# Patient Record
Sex: Female | Born: 1995 | Race: Black or African American | Hispanic: No | Marital: Single
Health system: Southern US, Community
[De-identification: ages and names within clinical notes are randomized; demographics above are authoritative.]

---

## 2020-07-03 ENCOUNTER — Emergency Department (HOSPITAL_COMMUNITY): Payer: Self-pay

## 2020-07-03 ENCOUNTER — Other Ambulatory Visit: Payer: Self-pay

## 2020-07-03 ENCOUNTER — Encounter (HOSPITAL_COMMUNITY): Payer: Self-pay

## 2020-07-03 DIAGNOSIS — Z20822 Contact with and (suspected) exposure to covid-19: Secondary | ICD-10-CM | POA: Insufficient documentation

## 2020-07-03 DIAGNOSIS — B349 Viral infection, unspecified: Secondary | ICD-10-CM | POA: Insufficient documentation

## 2020-07-03 DIAGNOSIS — N3000 Acute cystitis without hematuria: Secondary | ICD-10-CM | POA: Insufficient documentation

## 2020-07-03 LAB — COMPREHENSIVE METABOLIC PANEL
ALT: 31 U/L (ref 0–44)
AST: 30 U/L (ref 15–41)
Albumin: 4.2 g/dL (ref 3.5–5.0)
Alkaline Phosphatase: 126 U/L (ref 38–126)
Anion gap: 12 (ref 5–15)
BUN: 8 mg/dL (ref 6–20)
CO2: 19 mmol/L — ABNORMAL LOW (ref 22–32)
Calcium: 10.5 mg/dL — ABNORMAL HIGH (ref 8.9–10.3)
Chloride: 101 mmol/L (ref 98–111)
Creatinine, Ser: 0.84 mg/dL (ref 0.44–1.00)
GFR, Estimated: >60 mL/min (ref >60)
Glucose, Bld: 137 mg/dL — ABNORMAL HIGH (ref 70–99)
Potassium: 3.2 mmol/L — ABNORMAL LOW (ref 3.5–5.1)
Sodium: 132 mmol/L — ABNORMAL LOW (ref 135–145)
Total Bilirubin: 1.9 mg/dL — ABNORMAL HIGH (ref 0.3–1.2)
Total Protein: 8.3 g/dL — ABNORMAL HIGH (ref 6.5–8.1)

## 2020-07-03 LAB — CBC
HCT: 42.2 % (ref 36.0–46.0)
Hemoglobin: 14.5 g/dL (ref 12.0–15.0)
MCH: 30.9 pg (ref 26.0–34.0)
MCHC: 34.4 g/dL (ref 30.0–36.0)
MCV: 90 fL (ref 80.0–100.0)
Platelets: 215 10*3/uL (ref 150–400)
RBC: 4.69 MIL/uL (ref 3.87–5.11)
RDW: 13 % (ref 11.5–15.5)
WBC: 26.5 10*3/uL — ABNORMAL HIGH (ref 4.0–10.5)
nRBC: 0 % (ref 0.0–0.2)

## 2020-07-03 LAB — I-STAT BETA HCG BLOOD, ED (NOT ORDERABLE): I-stat hCG, quantitative: <5 m[IU]/mL (ref <5)

## 2020-07-03 LAB — LIPASE, BLOOD: Lipase: 21 U/L (ref 11–51)

## 2020-07-03 MED ORDER — ONDANSETRON 4 MG PO TBDP
4.0000 mg | ORAL_TABLET | Freq: Once | ORAL | Status: AC | PRN
  Administered 2020-07-03: 4 mg via ORAL
  Filled 2020-07-03: qty 1

## 2020-07-03 NOTE — ED Notes (Signed)
Patient refusing covid-19 swab. Stating she is dehydrated and needs a room that she cannot go to the lobby, this nurse explained the importance of getting tested and reassured we will get her seen as soon as possible.

## 2020-07-03 NOTE — ED Triage Notes (Addendum)
Patient arrived with complaints of NVD, shortness of breath, a fever and generalized body aches that started yesterday. States last dose of tylenol taken at 6pm.

## 2020-07-04 ENCOUNTER — Emergency Department (HOSPITAL_COMMUNITY)
Admission: EM | Admit: 2020-07-04 | Discharge: 2020-07-04 | Disposition: A | Discharge status: Home / Self Care | Payer: Self-pay | Attending: Emergency Medicine | Admitting: Emergency Medicine

## 2020-07-04 ENCOUNTER — Other Ambulatory Visit: Payer: Self-pay

## 2020-07-04 DIAGNOSIS — J069 Acute upper respiratory infection, unspecified: Secondary | ICD-10-CM | POA: Insufficient documentation

## 2020-07-04 DIAGNOSIS — R519 Headache, unspecified: Secondary | ICD-10-CM | POA: Insufficient documentation

## 2020-07-04 DIAGNOSIS — B349 Viral infection, unspecified: Secondary | ICD-10-CM

## 2020-07-04 DIAGNOSIS — N3000 Acute cystitis without hematuria: Secondary | ICD-10-CM

## 2020-07-04 LAB — RESP PANEL BY RT-PCR (FLU A&B, COVID) ARPGX2
Influenza A by PCR: NEGATIVE
Influenza B by PCR: NEGATIVE
SARS Coronavirus 2 by RT PCR: NEGATIVE

## 2020-07-04 LAB — URINALYSIS, ROUTINE W REFLEX MICROSCOPIC
Bilirubin Urine: NEGATIVE
Glucose, UA: NEGATIVE mg/dL
Ketones, ur: 5 mg/dL — AB
Nitrite: NEGATIVE
Protein, ur: 30 mg/dL — AB
Specific Gravity, Urine: 1.018 (ref 1.005–1.030)
pH: 5 (ref 5.0–8.0)

## 2020-07-04 MED ORDER — ACETAMINOPHEN 325 MG PO TABS
650.0000 mg | ORAL_TABLET | Freq: Once | ORAL | Status: AC
  Administered 2020-07-04: 650 mg via ORAL
  Filled 2020-07-04: qty 2

## 2020-07-04 MED ORDER — KETOROLAC TROMETHAMINE 15 MG/ML IJ SOLN
15.0000 mg | Freq: Once | INTRAMUSCULAR | Status: AC
  Administered 2020-07-04: 15 mg via INTRAVENOUS
  Filled 2020-07-04: qty 1

## 2020-07-04 MED ORDER — SODIUM CHLORIDE 0.9 % IV BOLUS
1000.0000 mL | Freq: Once | INTRAVENOUS | Status: AC
  Administered 2020-07-04: 1000 mL via INTRAVENOUS

## 2020-07-04 MED ORDER — ONDANSETRON 4 MG PO TBDP: ORAL_TABLET | ORAL | 0 refills | Status: AC

## 2020-07-04 MED ORDER — CEPHALEXIN 500 MG PO CAPS: 500.0000 mg | ORAL_CAPSULE | Freq: Four times a day (QID) | ORAL | 0 refills | Status: AC

## 2020-07-04 MED ORDER — ACETAMINOPHEN 500 MG PO TABS
1000.0000 mg | ORAL_TABLET | Freq: Once | ORAL | Status: AC
  Administered 2020-07-04: 1000 mg via ORAL
  Filled 2020-07-04: qty 2

## 2020-07-04 MED ORDER — ONDANSETRON HCL 4 MG/2ML IJ SOLN
4.0000 mg | Freq: Once | INTRAMUSCULAR | Status: AC
  Administered 2020-07-04: 4 mg via INTRAVENOUS
  Filled 2020-07-04: qty 2

## 2020-07-04 MED ORDER — CEPHALEXIN 500 MG PO CAPS
1000.0000 mg | ORAL_CAPSULE | Freq: Once | ORAL | Status: AC
  Administered 2020-07-04: 1000 mg via ORAL
  Filled 2020-07-04: qty 2

## 2020-07-04 NOTE — ED Triage Notes (Signed)
Pt BIBA from home-  Per EMS- Pt c/o n/v and headache. ShOB.  Pt seen here yesterday and dx with kidney infection.  Pt unable to have rx filled until tomorrow.  Pt reports headache intolerable. Pt reports poor PO intake x3 days.   AOx4, ambulatory.

## 2020-07-04 NOTE — ED Provider Notes (Signed)
[facility]-EM[facility] DEPT Provider Note   CSN: [id number] Arrival date & time: 07/03/20  2145     History Chief Complaint  Patient presents with  . Fever    Samantha Vaughn is a 24 y.o. female.  24 yo F with a chief complaints of cough congestion fevers chills myalgias headache nausea vomiting and diarrhea.  Going on for about 48 hours now.  No known sick contacts.  Difficulty keeping anything down over the past couple days.  Diffuse abdominal pain improved with vomiting.  No recent travel.  The history is provided by the patient.  Fever Associated symptoms: chills, cough, diarrhea, myalgias, nausea and vomiting   Associated symptoms: no chest pain, no congestion, no dysuria, no headaches and no rhinorrhea   Illness Severity:  Moderate Onset quality:  Gradual Duration:  2 days Timing:  Constant Progression:  Worsening Chronicity:  New Associated symptoms: cough, diarrhea, fever, myalgias, nausea, shortness of breath and vomiting   Associated symptoms: no chest pain, no congestion, no headaches, no rhinorrhea and no wheezing        History reviewed. No pertinent past medical history.  There are no problems to display for this patient.   History reviewed. No pertinent surgical history.   OB History   No obstetric history on file.     No family history on file.  Social History   Tobacco Use  . Smoking status: Not on file  Substance Use Topics  . Alcohol use: Not on file  . Drug use: Not on file    Home Medications Prior to Admission medications   Medication Sig Start Date End Date Taking? Authorizing Provider  cephALEXin (KEFLEX) 500 MG capsule Take 1 capsule (500 mg total) by mouth 4 (four) times daily. 07/04/20   Melene Plan, DO  ondansetron (ZOFRAN ODT) 4 MG disintegrating tablet 4mg  ODT q4 hours prn nausea/vomit 07/04/20   14/8/21, DO    Allergies    Patient has no known allergies.  Review of Systems   Review of Systems   Constitutional: Positive for chills and fever.  HENT: Negative for congestion and rhinorrhea.   Eyes: Negative for redness and visual disturbance.  Respiratory: Positive for cough and shortness of breath. Negative for wheezing.   Cardiovascular: Negative for chest pain and palpitations.  Gastrointestinal: Positive for diarrhea, nausea and vomiting.  Genitourinary: Negative for dysuria and urgency.  Musculoskeletal: Positive for myalgias. Negative for arthralgias.  Skin: Negative for pallor and wound.  Neurological: Negative for dizziness and headaches.    Physical Exam Updated Vital Signs BP 129/67 (BP Location: Right Arm)   Pulse 80   Temp 98.3 F (36.8 C) (Oral)   Resp 16   LMP 06/03/2020   SpO2 98%   Physical Exam Vitals and nursing note reviewed.  Constitutional:      General: She is not in acute distress.    Appearance: She is well-developed. She is not diaphoretic.  HENT:     Head: Normocephalic and atraumatic.  Eyes:     Pupils: Pupils are equal, round, and reactive to light.  Cardiovascular:     Rate and Rhythm: Normal rate and regular rhythm.     Heart sounds: No murmur heard.  No friction rub. No gallop.   Pulmonary:     Effort: Pulmonary effort is normal.     Breath sounds: No wheezing or rales.  Abdominal:     General: There is no distension.     Palpations: Abdomen is soft.  Tenderness: There is no abdominal tenderness.     Comments: Benign abdominal exam  Musculoskeletal:        General: No tenderness.     Cervical back: Normal range of motion and neck supple.     Right lower leg: No edema.     Left lower leg: No edema.  Skin:    General: Skin is warm and dry.  Neurological:     Mental Status: She is alert and oriented to person, place, and time.  Psychiatric:        Behavior: Behavior normal.     ED Results / Procedures / Treatments   Labs (all labs ordered are listed, but only abnormal results are displayed) Labs Reviewed   COMPREHENSIVE METABOLIC PANEL - Abnormal; Notable for the following components:      Result Value   Sodium 132 (*)    Potassium 3.2 (*)    CO2 19 (*)    Glucose, Bld 137 (*)    Calcium 10.5 (*)    Total Protein 8.3 (*)    Total Bilirubin 1.9 (*)    All other components within normal limits  CBC - Abnormal; Notable for the following components:   WBC 26.5 (*)    All other components within normal limits  URINALYSIS, ROUTINE W REFLEX MICROSCOPIC - Abnormal; Notable for the following components:   APPearance HAZY (*)    Hgb urine dipstick SMALL (*)    Ketones, ur 5 (*)    Protein, ur 30 (*)    Leukocytes,Ua TRACE (*)    Bacteria, UA MANY (*)    All other components within normal limits  RESP PANEL BY RT-PCR (FLU A&B, COVID) ARPGX2  LIPASE, BLOOD  I-STAT BETA HCG BLOOD, ED ([facility], [facility], [facility] ONLY)  I-STAT BETA HCG BLOOD, ED (NOT ORDERABLE)    EKG EKG Interpretation  Date/Time:  Tuesday July 03 2020 22:16:13 EST Ventricular Rate:  82 PR Interval:    QRS Duration: 84 QT Interval:  328 QTC Calculation: 383 R Axis:   88 Text Interpretation: Sinus rhythm Nonspecific repol abnormality, inferior leads ST elevation, consider anterolateral injury 12 Lead; Mason-Likar No old tracing to compare Confirmed by Melene Plan [phone]) on 07/04/2020 12:36:40 AM   Radiology DG Chest 2 View  Result Date: 07/03/2020 CLINICAL DATA:  Cough and shortness of breath. EXAM: CHEST - 2 VIEW COMPARISON:  None. FINDINGS: The heart size and mediastinal contours are within normal limits. Both lungs are clear. The visualized skeletal structures are unremarkable. IMPRESSION: No active cardiopulmonary disease. Electronically Signed   By: Aram Candela M.D.   On: 07/03/2020 22:26    Procedures Procedures (including critical care time)  Medications Ordered in ED Medications  ondansetron (ZOFRAN-ODT) disintegrating tablet 4 mg (4 mg Oral Given 07/03/20 2212)  sodium chloride 0.9 % bolus 1,000 mL (0 mLs  Intravenous Stopped 07/04/20 0405)  acetaminophen (TYLENOL) tablet 1,000 mg (1,000 mg Oral Given 07/04/20 0231)  ketorolac (TORADOL) 15 MG/ML injection 15 mg (15 mg Intravenous Given 07/04/20 0229)  ondansetron (ZOFRAN) injection 4 mg (4 mg Intravenous Given 07/04/20 0229)  sodium chloride 0.9 % bolus 1,000 mL (1,000 mLs Intravenous New Bag/Given 07/04/20 0442)    ED Course  I have reviewed the triage vital signs and the nursing notes.  Pertinent labs & imaging results that were available during my care of the patient were reviewed by me and considered in my medical decision making (see chart for details).    MDM Rules/Calculators/A&P  24 yo F with a chief complaints of cough congestion fevers chills myalgias headache nausea vomiting diarrhea.  Reportedly unable to keep anything down for the past couple days.  Appears well-hydrated on initial exam.  Had lab work done in triage with a significant leukocytosis, mild hyponatremia and hypokalemia.  Mild acidosis without anion gap.  Clear lung sounds for me.  We will give a bolus of IV fluids symptomatic therapy.  Reassess.  Patient feeling better but still complaining of a headache.  Patient continues to have no meningismus.  UA with possible urinary tract infection.  Patient not endorsing any urinary symptoms earlier now endorsing that she is having frequency.  Will start on antibiotics.  PCP follow-up.  5:05 AM:  I have discussed the diagnosis/risks/treatment options with the patient and believe the pt to be eligible for discharge home to follow-up with PCP. We also discussed returning to the ED immediately if new or worsening sx occur. We discussed the sx which are most concerning (e.g., sudden worsening pain, fever, inability to tolerate by mouth) that necessitate immediate return. Medications administered to the patient during their visit and any new prescriptions provided to the patient are listed below.  Medications given  during this visit Medications  ondansetron (ZOFRAN-ODT) disintegrating tablet 4 mg (4 mg Oral Given 07/03/20 2212)  sodium chloride 0.9 % bolus 1,000 mL (0 mLs Intravenous Stopped 07/04/20 0405)  acetaminophen (TYLENOL) tablet 1,000 mg (1,000 mg Oral Given 07/04/20 0231)  ketorolac (TORADOL) 15 MG/ML injection 15 mg (15 mg Intravenous Given 07/04/20 0229)  ondansetron (ZOFRAN) injection 4 mg (4 mg Intravenous Given 07/04/20 0229)  sodium chloride 0.9 % bolus 1,000 mL (1,000 mLs Intravenous New Bag/Given 07/04/20 0442)     The patient appears reasonably screen and/or stabilized for discharge and I doubt any other medical condition or other [facility] requiring further screening, evaluation, or treatment in the ED at this time prior to discharge.   Final Clinical Impression(s) / ED Diagnoses Final diagnoses:  Viral syndrome  Acute cystitis without hematuria    Rx / DC Orders ED Discharge Orders         Ordered    cephALEXin (KEFLEX) 500 MG capsule  4 times daily        07/04/20 0504    ondansetron (ZOFRAN ODT) 4 MG disintegrating tablet        07/04/20 0504           Melene Plan, DO 07/04/20 0505

## 2020-07-04 NOTE — ED Notes (Signed)
Alert and ambulatory. AVS reviewed. Discharged no concerns.

## 2020-07-04 NOTE — Discharge Instructions (Addendum)
Take tylenol 2 pills 4 times a day and motrin 4 pills 3 times a day.  Drink plenty of fluids.  Return for worsening shortness of breath, headache, confusion. Follow up with your family doctor.   

## 2020-07-04 NOTE — ED Notes (Signed)
Pt ambulated with pulse ox. Pt's oxygen level stayed at 94-99, but her respirations increased from 16-30.

## 2020-07-05 ENCOUNTER — Emergency Department (HOSPITAL_COMMUNITY)
Admission: EM | Admit: 2020-07-05 | Discharge: 2020-07-05 | Disposition: A | Discharge status: Home / Self Care | Payer: Self-pay | Attending: Emergency Medicine | Admitting: Emergency Medicine

## 2020-07-05 DIAGNOSIS — J069 Acute upper respiratory infection, unspecified: Secondary | ICD-10-CM

## 2020-07-05 MED ORDER — KETOROLAC TROMETHAMINE 30 MG/ML IJ SOLN
INTRAMUSCULAR | Status: AC
  Administered 2020-07-05: 30 mg
  Filled 2020-07-05: qty 1

## 2020-07-05 MED ORDER — ALBUTEROL SULFATE HFA 108 (90 BASE) MCG/ACT IN AERS
INHALATION_SPRAY | RESPIRATORY_TRACT | Status: AC
  Administered 2020-07-05: 8
  Filled 2020-07-05: qty 6.7

## 2020-07-05 MED ORDER — DIPHENHYDRAMINE HCL 50 MG/ML IJ SOLN
INTRAMUSCULAR | Status: AC
  Administered 2020-07-05: 25 mg
  Filled 2020-07-05: qty 1

## 2020-07-05 MED ORDER — PROCHLORPERAZINE EDISYLATE 10 MG/2ML IJ SOLN
INTRAMUSCULAR | Status: AC
  Administered 2020-07-05: 10 mg
  Filled 2020-07-05: qty 2

## 2020-07-05 MED ORDER — SODIUM CHLORIDE 0.9 % IV BOLUS
1000.0000 mL | Freq: Once | INTRAVENOUS | Status: AC
  Administered 2020-07-05: 1000 mL via INTRAVENOUS

## 2020-07-05 MED ORDER — IBUPROFEN 200 MG PO TABS
ORAL_TABLET | ORAL | Status: AC
  Administered 2020-07-05: 600 mg
  Filled 2020-07-05: qty 3

## 2020-07-05 NOTE — ED Notes (Signed)
Refuses covid test and PA is aware.

## 2020-07-05 NOTE — Discharge Instructions (Addendum)
Thank you for allowing me to care for you today in the Emergency Department.   Your work-up is suggestive of a viral illness.  Although you tested negative for Covid and influenza, there are other viral illnesses that can cause the symptoms.  Take 650 mg of Tylenol or 600 mg of ibuprofen with food every 6 hours for pain, headache, and body aches.  You can alternate between these 2 medications every 3 hours if your pain returns.  For instance, you can take Tylenol at noon, followed by a dose of ibuprofen at 3, followed by second dose of Tylenol and 6.  Tylenol is safe to take as long as you do not take more than 4000 mg in a 24-hour period.  You can use the albuterol inhaler that you were given in the ER every 4 hours as needed for cough, chest tightness, or shortness of breath.  Make sure that you are drinking plenty of fluids to avoid dehydration.  You can follow-up with student health on campus or by getting established with a primary care provider.  Call the number on your discharge paperwork if you like to get established with a primary care provider.  You should stay home until your fever resolves.  If you continue to have fever, temperature greater than 100.4, that does not improve in the next 3 to 4 days, you should follow-up with primary care or consider returning to the ER.  You should return to the ER if you develop respiratory distress, if you become unable to move your neck, if you develop confusion, uncontrollable vomiting, if you stop urinating, or if you develop other new, concerning symptoms.

## 2020-07-05 NOTE — ED Provider Notes (Signed)
[facility]   CSN: [id number] Arrival date & time: 07/04/20  1726     History Chief Complaint  Patient presents with  . Shortness of Breath    [facility] is a 24 y.o. female with no chronic medical conditions who presents to the emergency department by EMS with a chief complaint of URI symptoms.  The patient reports that she has had 3 days of headache, cough, chest tightness, shortness of breath, headache, myalgias, fever, and chills.  Reports a T-max of 103 earlier today.  She has taken approximately 3 doses of Tylenol.  The patient was seen for the same symptoms yesterday in the ER, but at that time was having nausea, vomiting, and diarrhea.  The symptoms resolved approximately 24 hours ago.  She has been able to tolerate p.o., but reports that she has not felt like eating and drinking as much.  States that her headache is all over and is characterized as throbbing.  It has been constant for the last 3 days, but has worsened today.  No history of migraines.  No known aggravating or alleviating factors.  She denies abdominal pain, dysuria, hematuria, neck stiffness, visual changes, sore throat, ear pain, loss of sense of taste or smell, chest pain, leg swelling, palpitations, visual changes, dizziness, lightheadedness, numbness, or weakness.  No known sick contacts.  She is unvaccinated against COVID-19.  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  She denies recent long travel, surgery, or immobilization.  No family history of VTE.  She is not on birth control.  No history of Marfan syndrome.  The history is provided by the patient and medical records. No language interpreter was used.       No past medical history on file.  There are no problems to display for this patient.   No past surgical history on file.   OB History   No obstetric history on file.     No family history on file.      Home Medications Prior to Admission medications   Medication Sig Start Date End Date Taking? Authorizing Provider  cephALEXin (KEFLEX) 500 MG capsule Take 1 capsule (500 mg total) by mouth 4 (four) times daily. 07/04/20   Melene Plan, DO  ondansetron (ZOFRAN ODT) 4 MG disintegrating tablet 4mg  ODT q4 hours prn nausea/vomit 07/04/20   14/8/21, DO    Allergies    Patient has no known allergies.  Review of Systems   Review of Systems  Constitutional: Positive for chills and fever. Negative for activity change.  HENT: Negative for congestion, sinus pressure, sinus pain and sore throat.   Eyes: Negative for visual disturbance.  Respiratory: Positive for cough, chest tightness and shortness of breath. Negative for wheezing.   Cardiovascular: Negative for chest pain, palpitations and leg swelling.  Gastrointestinal: Positive for diarrhea (resolved) and vomiting (resolved). Negative for abdominal pain and blood in stool.  Genitourinary: Negative for decreased urine volume, dysuria, flank pain, frequency, urgency and vaginal bleeding.  Musculoskeletal: Positive for myalgias. Negative for arthralgias, back pain, gait problem, neck pain and neck stiffness.  Skin: Negative for rash and wound.  Allergic/Immunologic: Negative for immunocompromised state.  Neurological: Positive for headaches. Negative for dizziness, seizures, syncope, weakness, light-headedness and numbness.  Psychiatric/Behavioral: Negative for confusion.    Physical Exam Updated Vital Signs BP 130/60 (BP Location: Left Arm)   Pulse 70   Temp 99.9 F (37.7 C) (Oral)   Resp 18  Ht 6\' 2"  (1.88 m)   Wt 99.8 kg   SpO2 100%   BMI 28.25 kg/m   Physical Exam Vitals and nursing note reviewed.  Constitutional:      General: She is not in acute distress.    Appearance: She is not ill-appearing, toxic-appearing or diaphoretic.     Comments: Nontoxic-appearing.  No acute distress.  HENT:     Head: Normocephalic.     Right  Ear: Tympanic membrane, ear canal and external ear normal.     Left Ear: Tympanic membrane, ear canal and external ear normal.     Nose: Rhinorrhea present.  Eyes:     Extraocular Movements: Extraocular movements intact.     Conjunctiva/sclera: Conjunctivae normal.     Pupils: Pupils are equal, round, and reactive to light.  Neck:     Comments: No meningismus. Cardiovascular:     Rate and Rhythm: Normal rate and regular rhythm.     Pulses: Normal pulses.     Heart sounds: Normal heart sounds. No murmur heard. No friction rub. No gallop.   Pulmonary:     Effort: Pulmonary effort is normal. No respiratory distress.     Breath sounds: No stridor. No wheezing, rhonchi or rales.     Comments: Lungs are clear to auscultation bilaterally. Chest:     Chest wall: No tenderness.  Abdominal:     General: There is no distension.     Palpations: Abdomen is soft. There is no mass.     Tenderness: There is no abdominal tenderness. There is no right CVA tenderness, left CVA tenderness, guarding or rebound.     Hernia: No hernia is present.     Comments: Abdomen is soft, nontender, nondistended.  No CVA tenderness bilaterally.  Musculoskeletal:     Cervical back: Normal range of motion and neck supple.     Right lower leg: No edema.     Left lower leg: No edema.  Skin:    General: Skin is warm.     Capillary Refill: Capillary refill takes less than 2 seconds.     Findings: No rash.  Neurological:     General: No focal deficit present.     Mental Status: She is alert.  Psychiatric:        Behavior: Behavior normal.     ED Results / Procedures / Treatments   Labs (all labs ordered are listed, but only abnormal results are displayed) Labs Reviewed - No data to display  EKG None  Radiology DG Chest 2 View  Result Date: 07/03/2020 CLINICAL DATA:  Cough and shortness of breath. EXAM: CHEST - 2 VIEW COMPARISON:  None. FINDINGS: The heart size and mediastinal contours are within normal  limits. Both lungs are clear. The visualized skeletal structures are unremarkable. IMPRESSION: No active cardiopulmonary disease. Electronically Signed   By: 14/01/2020 M.D.   On: 07/03/2020 22:26    Procedures Procedures (including critical care time)  Medications Ordered in ED Medications  acetaminophen (TYLENOL) tablet 650 mg (650 mg Oral Given 07/04/20 1857)  ibuprofen (ADVIL) 200 MG tablet (600 mg  Given 07/05/20 0135)  albuterol (VENTOLIN HFA) 108 (90 Base) MCG/ACT inhaler (8 puffs  Given 07/05/20 0215)  prochlorperazine (COMPAZINE) 10 MG/2ML injection (10 mg  Given 07/05/20 0212)  ketorolac (TORADOL) 30 MG/ML injection (30 mg  Given 07/05/20 0213)  diphenhydrAMINE (BENADRYL) 50 MG/ML injection (25 mg  Given 07/05/20 0214)  sodium chloride 0.9 % bolus 1,000 mL (0 mLs Intravenous Stopped 07/05/20 0258)  ED Course  I have reviewed the triage vital signs and the nursing notes.  Pertinent labs & imaging results that were available during my care of the patient were reviewed by me and considered in my medical decision making (see chart for details).    MDM Rules/Calculators/A&P                          24 year old female who is otherwise healthy who presents to the ER for her second visit in 2 days with headache, fever, chills, neck pain, cough, shortness of breath, chest tightness.  She was having nausea, vomiting, and diarrhea, but the symptoms resolved 24 hours ago.  The patient was discussed with Dr. Adela Lank, attending physician, who cared for the patient during her last ER visit.  All medical records have been reviewed. Patient mildly hypertensive, afebrile.  No hypoxia, tachypnea, or tachycardia.  Patient underwent a thorough review during her ER visit yesterday.  She tested negative for COVID-19 influenza.  She had bacteria and trace leukocyte esterase on her UA, but no urine culture was sent.  She is having no urinary complaints.  I have a low suspicion for obstructive uropathy  or pyelonephritis at this time.  Abdomen is benign.  She did have a significant leukocytosis yesterday of uncertain etiology.  There could be a component of dehydration as she endorsed poor p.o. intake and bicarb is noted to be 19.  Chest x-ray was unremarkable.  Today, her primary complaint is her headache.  Although she was endorsing headache yesterday, she was noted to have no meningismus.  She has no meningismus today and is moving her head all around.  She has no notable lymphadenopathy.  Could consider viral meningitis in the setting of viral URI symptoms.  I discussed with the patient she was agreeable to treating her symptoms with migraine cocktail.  Doubt bacterial meningitis, community-acquired pneumonia, cholecystitis, or appendicitis. She is PERC negative and doubt PE.  After migraine cocktail, her headache is resolved.  She was also given albuterol inhaler for chest tightness and on reevaluation, chest tightness and shortness of breath have resolved.  She has been resting comfortably and in no acute distress.  She is remained afebrile.  She is nontoxic-appearing.  Considered repeating imaging and labs, but given that her symptoms have been improving (resolution of nausea, vomiting, diarrhea), repeat imaging and blood work is not indicated at this time.  I did offer the patient repeat COVID-19 testing since she is not currently immunized, but she declined.  Given that she is well-appearing and in no acute distress, will plan to discharge the patient home with viral URI symptoms.  She was encouraged to follow-up with primary care if fever persists for more than 3 to 4 days.  Strict ER return precautions given.  She is hemodynamically stable no acute distress.  Safe for discharge home with outpatient follow-up as indicated.   Final Clinical Impression(s) / ED Diagnoses Final diagnoses:  Viral upper respiratory illness    Rx / DC Orders ED Discharge Orders    None       Henery Betzold A,  PA-C 07/05/20 0805    Melene Plan, DO 07/05/20 2259

## 2021-12-23 IMAGING — CR DG CHEST 2V
2 series · 2 of 2 positions shown · non-contrast
Comparison: None.

CLINICAL DATA: Cough and shortness of breath.

EXAM:
CHEST - 2 VIEW

[w chest pa]
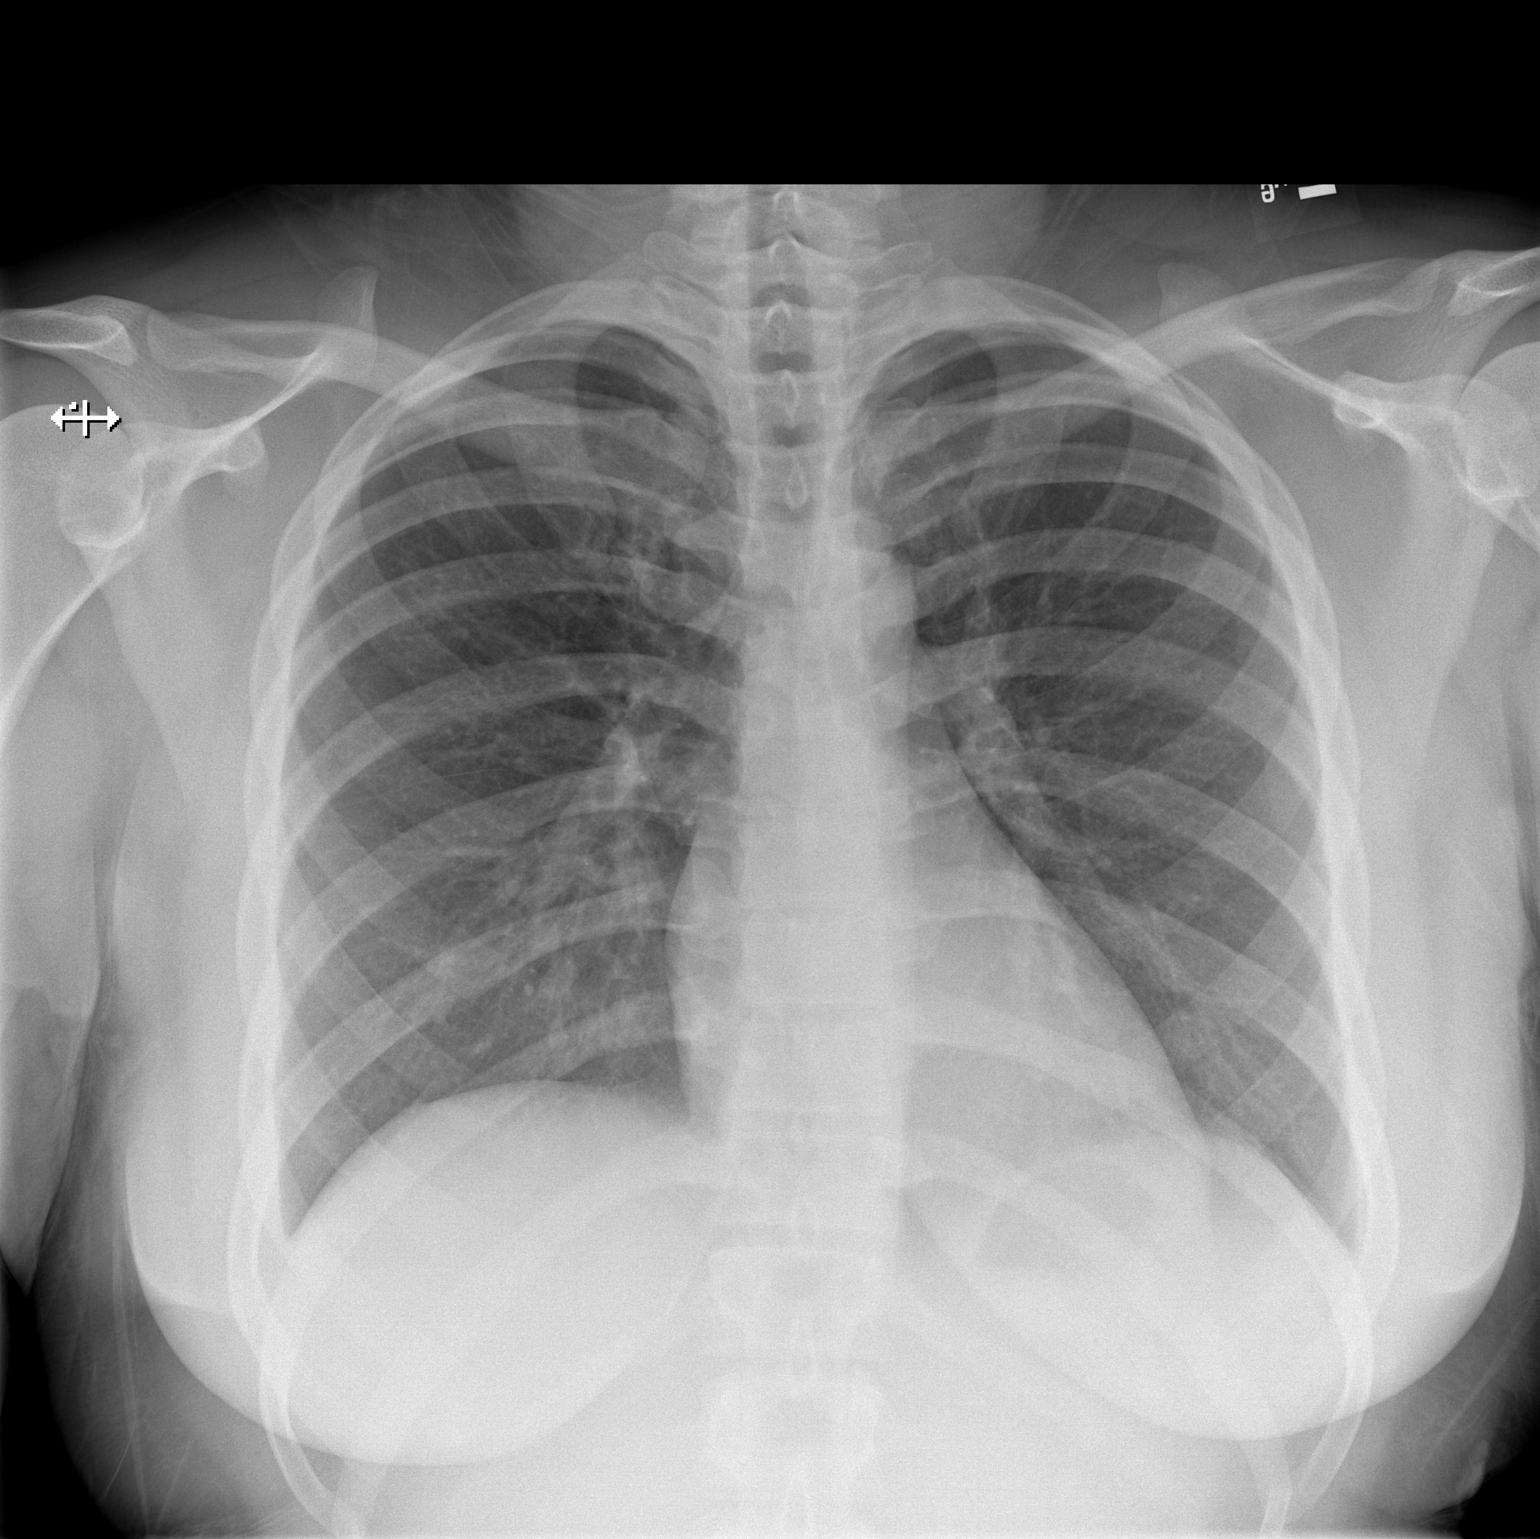

[w chest lat]
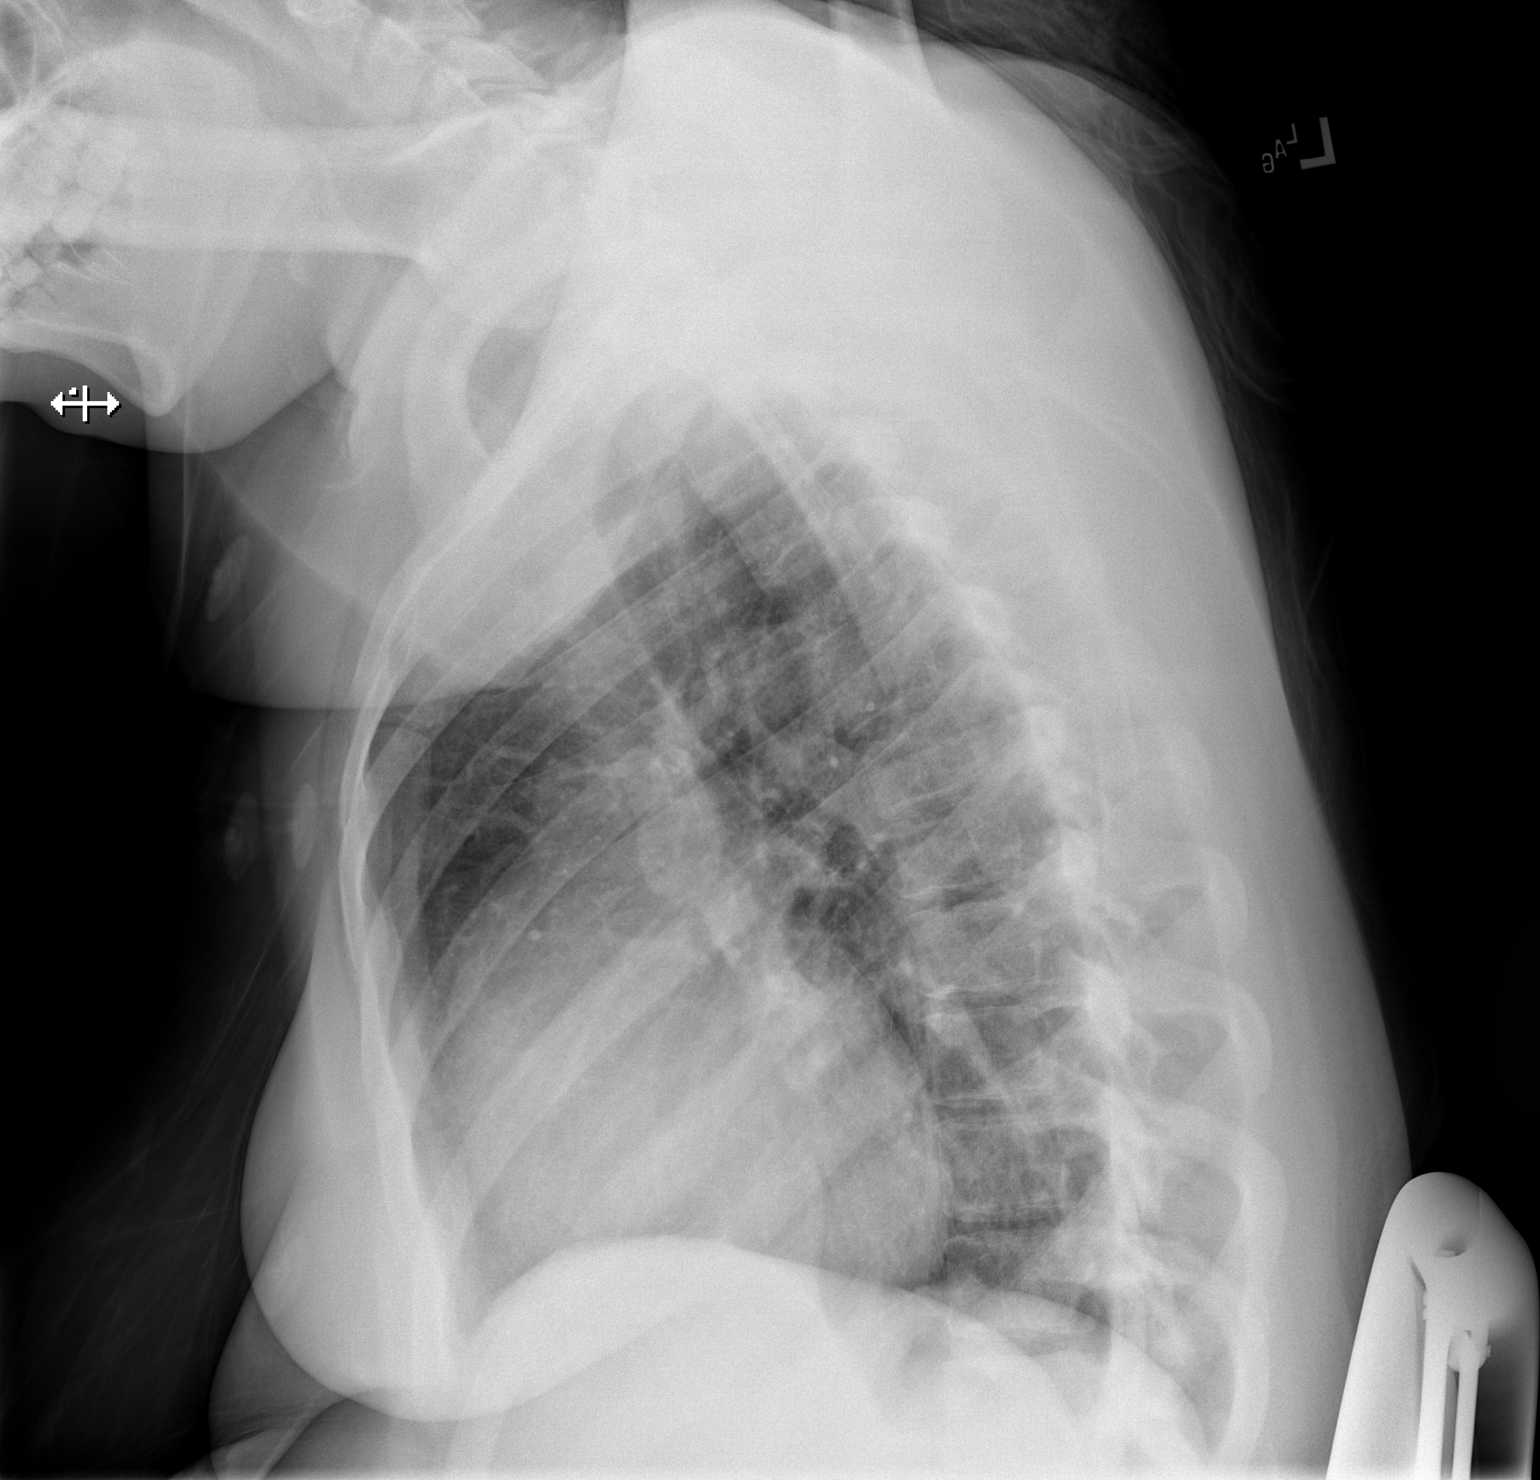

[2 of 2 positions shown; findings below may reference images not displayed]

FINDINGS: The heart size and mediastinal contours are within normal limits.
Both lungs are clear. The visualized skeletal structures are
unremarkable.
IMPRESSION: No active cardiopulmonary disease.
